# Patient Record
Sex: Female | Born: 2000 | Race: White | Hispanic: No | Marital: Single | Smoking: Never smoker
Health system: Southern US, Community
[De-identification: ages and names within clinical notes are randomized; demographics above are authoritative.]

---
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
## 2021-02-15 ENCOUNTER — Other Ambulatory Visit: Payer: Self-pay

## 2021-02-15 ENCOUNTER — Encounter: Payer: Self-pay | Admitting: Family

## 2021-02-15 ENCOUNTER — Ambulatory Visit: Payer: Commercial Managed Care - PPO | Admitting: Family

## 2021-02-15 VITALS — BP 120/83 | HR 67 | Temp 98.7°F | Ht 68.5 in | Wt 162.4 lb

## 2021-02-15 DIAGNOSIS — R1084 Generalized abdominal pain: Secondary | ICD-10-CM | POA: Diagnosis not present

## 2021-02-15 DIAGNOSIS — K30 Functional dyspepsia: Secondary | ICD-10-CM

## 2021-02-15 MED ORDER — FAMOTIDINE 20 MG PO TABS
20.0000 mg | ORAL_TABLET | Freq: Two times a day (BID) | ORAL | 0 refills | Status: AC
Start: 2021-02-15 — End: ?

## 2021-02-15 NOTE — Progress Notes (Signed)
Subjective:     Patient ID: Wendy Hall, female    DOB: Sep 12, 2000, 20 y.o.   MRN: [id number]  Chief Complaint  Patient presents with   Abdominal Pain    Noticing in Aug when going away to school. Increased pain after eating. She denies nausea and vomiting.       Abdominal Pain  Abdominal Pain: Patient complains of abdominal pain. The pain is described as bloating, aching and cramping. Pain is located in the diffusely without radiation. Onset was 2 months ago. Symptoms have been unchanged since. Aggravating factors: eating.  Alleviating factors: belching and flatus. Associated symptoms: nausea. The patient denies constipation, diarrhea, fever, melena, sweats, and vomiting.    Health Maintenance Due  Topic Date Due   HPV VACCINES (1 - 2-dose series) Never done   HIV Screening  Never done   Hepatitis C Screening  Never done   INFLUENZA VACCINE  Never done    History reviewed. No pertinent past medical history.  History reviewed. No pertinent surgical history.  Outpatient Medications Prior to Visit  Medication Sig Dispense Refill   Zinc 50 MG TABS 1 tablet     atovaquone-proguanil (MALARONE) 250-100 MG TABS tablet Take one tablet daily 1-2 days before trip, daily while on trip and then daily for 7 days once return home. Take with food. (Patient not taking: Reported on 02/15/2021) 20 tablet 0   azithromycin (ZITHROMAX) 500 MG tablet Take one tablet daily for 3 days for travelers diarrhea. Used for international travel. (Patient not taking: Reported on 02/15/2021) 3 tablet 0   No facility-administered medications prior to visit.    Allergies  Allergen Reactions   Kiwi Extract         Objective:    Physical Exam Vitals and nursing note reviewed.  Constitutional:      Appearance: Normal appearance.  Cardiovascular:     Rate and Rhythm: Normal rate and regular rhythm.  Pulmonary:     Effort: Pulmonary effort is normal.     Breath sounds: Normal breath sounds.   Musculoskeletal:        General: Normal range of motion.  Skin:    General: Skin is warm and dry.  Neurological:     Mental Status: She is alert.  Psychiatric:        Mood and Affect: Mood normal.        Behavior: Behavior normal.    BP 120/83   Pulse 67   Temp 98.7 F (37.1 C) (Temporal)   Ht 5' 8.5" (1.74 m)   Wt 162 lb 6.4 oz (73.7 kg)   LMP 01/30/2021 (Exact Date)   SpO2 97%   BMI 24.33 kg/m  Wt Readings from Last 3 Encounters:  02/15/21 162 lb 6.4 oz (73.7 kg) (88 %, Z= 1.20)*  04/06/20 169 lb 9.6 oz (76.9 kg) (92 %, Z= 1.42)*   * Growth percentiles are based on CDC (Girls, 2-20 Years) data.       Assessment & Plan:   Problem List Items Addressed This Visit       Other   Abdominal pain, diffuse - Primary    Occurs quickly after every meal, has to go to the bathroom and has soft or loose stools, occasionally with nausea. Denies any heartburn. Discussed food triggers to avoid including acidic foods, trying a food elimination diet starting with gluten foods, then dairy, one at a time. Will check food allergy panel.      Relevant Medications   famotidine (PEPCID)  20 MG tablet   Other Relevant Orders   Food Allergy Profile   Glia (IgA/G) + tTG IgA   RESOLVED: Indigestion    Meds ordered this encounter  Medications   famotidine (PEPCID) 20 MG tablet    Sig: Take 1 tablet (20 mg total) by mouth 2 (two) times daily before lunch and supper.    Dispense:  60 tablet    Refill:  0    Order Specific Question:   Supervising Provider    Answer:   ANDY, CAMILLE L [[id number]]

## 2021-02-15 NOTE — Assessment & Plan Note (Addendum)
Occurs quickly after every meal, has to go to the bathroom and has soft or loose stools, occasionally with nausea. Denies any heartburn. Discussed food triggers to avoid including acidic foods, trying a food elimination diet starting with gluten foods, then dairy, one at a time. Will check food allergy panel.

## 2021-02-15 NOTE — Addendum Note (Signed)
Addended by: Lorn Junes on: 02/15/2021 10:15 AM   Modules accepted: Orders

## 2021-02-15 NOTE — Assessment & Plan Note (Deleted)
Occurs quickly after every meal, has to go to the bathroom and has soft or loose stools, nausea.

## 2021-02-15 NOTE — Patient Instructions (Signed)
It was very nice to meet you today!  Go to the lab for blood work today, I will review in a few days and we will call you with the results. I have sent generic Pepcid to your pharmacy, start this today and take for at least 1-2 weeks to see if symptoms are better, then can reduce to 1 pill daily or every other day. If symptoms are not resolving, call the office.  PLEASE NOTE:  If you had any lab tests please let us know if you have not heard back within a few days. You may see your results on MyChart before we have a chance to review them but we will give you a call once they are reviewed by [place]. If we ordered any referrals today, please let us know if you have not heard from their office within the next week.   Please try these tips to maintain a healthy lifestyle:  Eat most of your calories during the day when you are active. Eliminate processed foods including packaged sweets (pies, cakes, cookies), reduce intake of potatoes, white bread, white pasta, and white rice. Look for whole grain options, oat flour or almond flour.  Each meal should contain half fruits/vegetables, one quarter protein, and one quarter carbs (no bigger than a computer mouse).  Cut down on sweet beverages. This includes juice, soda, and sweet tea. Also watch fruit intake, though this is a healthier sweet option, it still contains natural sugar! Limit to 3 servings daily.  Drink at least 1 glass of water with each meal and aim for at least 8 glasses per day  Exercise at least 150 minutes every week.

## 2021-02-17 LAB — FOOD ALLERGY PROFILE
Allergen, Salmon, f41: <0.1 kU/L
Almonds: <0.1 kU/L
CLASS: 0
CLASS: 0
CLASS: 0
CLASS: 0
CLASS: 0
CLASS: 0
CLASS: 0
CLASS: 0
CLASS: 0
CLASS: 0
CLASS: 0
Cashew IgE: <0.1 kU/L
Class: 0
Class: 0
Class: 0
Class: 0
Egg White IgE: <0.1 kU/L
Fish Cod: <0.1 kU/L
Hazelnut: <0.1 kU/L
Milk IgE: <0.1 kU/L
Peanut IgE: <0.1 kU/L
Scallop IgE: <0.1 kU/L
Sesame Seed f10: <0.1 kU/L
Shrimp IgE: <0.1 kU/L
Soybean IgE: <0.1 kU/L
Tuna IgE: <0.1 kU/L
Walnut: <0.1 kU/L
Wheat IgE: <0.1 kU/L

## 2021-02-17 LAB — INTERPRETATION:

## 2021-02-19 LAB — GLIA (IGA/G) + TTG IGA
Antigliadin Abs, IgA: 5 units (ref 0–19)
Gliadin IgG: 1 units (ref 0–19)
Transglutaminase IgA: <2 U/mL (ref 0–3)

## 2021-02-20 ENCOUNTER — Telehealth: Payer: Self-pay

## 2021-02-20 NOTE — Progress Notes (Signed)
No food allergens detected in lab work, and no gluten allergy.  How are you doing on the Pepcid twice a day? Has this helped your symptoms?

## 2021-02-20 NOTE — Telephone Encounter (Signed)
error 

## 2021-12-18 ENCOUNTER — Encounter: Payer: Self-pay | Admitting: *Deleted

## 2022-03-08 ENCOUNTER — Encounter: Payer: Self-pay | Admitting: *Deleted
